# Patient Record
Sex: Male | Born: 1985 | Race: Black or African American | Hispanic: No | Marital: Married | Smoking: Current some day smoker
Health system: Southern US, Community
[De-identification: ages and names within clinical notes are randomized; demographics above are authoritative.]

---

## 2009-09-25 ENCOUNTER — Emergency Department (HOSPITAL_COMMUNITY): Admission: EM | Admit: 2009-09-25 | Discharge: 2009-09-25 | Payer: Self-pay | Admitting: Emergency Medicine

## 2013-02-13 ENCOUNTER — Encounter (HOSPITAL_COMMUNITY): Payer: Self-pay | Admitting: Emergency Medicine

## 2013-02-13 ENCOUNTER — Emergency Department (HOSPITAL_COMMUNITY)
Admission: EM | Admit: 2013-02-13 | Discharge: 2013-02-13 | Disposition: A | Discharge status: Home / Self Care | Payer: BC Managed Care – PPO | Attending: Emergency Medicine | Admitting: Emergency Medicine

## 2013-02-13 DIAGNOSIS — Y93H9 Activity, other involving exterior property and land maintenance, building and construction: Secondary | ICD-10-CM | POA: Insufficient documentation

## 2013-02-13 DIAGNOSIS — IMO0002 Reserved for concepts with insufficient information to code with codable children: Secondary | ICD-10-CM | POA: Insufficient documentation

## 2013-02-13 DIAGNOSIS — M549 Dorsalgia, unspecified: Secondary | ICD-10-CM

## 2013-02-13 DIAGNOSIS — T148XXA Other injury of unspecified body region, initial encounter: Secondary | ICD-10-CM

## 2013-02-13 DIAGNOSIS — Y929 Unspecified place or not applicable: Secondary | ICD-10-CM | POA: Insufficient documentation

## 2013-02-13 DIAGNOSIS — R209 Unspecified disturbances of skin sensation: Secondary | ICD-10-CM | POA: Insufficient documentation

## 2013-02-13 DIAGNOSIS — F17200 Nicotine dependence, unspecified, uncomplicated: Secondary | ICD-10-CM | POA: Insufficient documentation

## 2013-02-13 MED ORDER — CYCLOBENZAPRINE HCL 10 MG PO TABS: 10.0000 mg | ORAL_TABLET | Freq: Three times a day (TID) | ORAL | Status: DC | PRN

## 2013-02-13 MED ORDER — NAPROXEN 500 MG PO TABS: 500.0000 mg | ORAL_TABLET | Freq: Two times a day (BID) | ORAL | Status: DC

## 2013-02-13 NOTE — ED Provider Notes (Signed)
CSN: [id number]     Arrival date & time 02/13/13  2203 History   First MD Initiated Contact with Patient 02/13/13 2317     This chart was scribed for non-physician practitioner, Ivonne Andrew PA-C,   working with Hanley Seamen, MD by Arlan Organ, ED Scribe. This patient was seen in room WTR6/WTR6 and the patient's care was started at 11:36 PM.   Chief Complaint  Patient presents with  . Back Pain   The history is provided by the patient. No language interpreter was used.   HPI Comments: Anthony Baldwin is a 27 y.o. male who presents to the Emergency Department complaining of back pain that started earlier this morning. Pt also reports numbness in his left hand. Pt states he woke up with numbness in his left hand this morning, and after taking a nap, he states he experienced excruciating pain in his back. He says he took OTC medication with no relief.  He describes the pain as sharp, and states movement worsens the pain. Pain is also worsened with coughing and some deep breathing. 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 He denies any specific injury or trauma. He states that he generally has a sore back every day but never with the similar sharp pains. Pt denies fever, chills, diaphoresis, swelling, or nausea. No dysuria, hematuria or urinary frequency. He denies using IV drugs.  Pt denies any recent travel. Pt states he is otherwise healthy.    History reviewed. No pertinent past medical history. History reviewed. No pertinent past surgical history. History reviewed. No pertinent family history. History  Substance Use Topics  . Smoking status: Current Some Day Smoker  . Smokeless tobacco: Not on file  . Alcohol Use: Yes    Review of Systems  Constitutional: Negative for fever, chills and diaphoresis.  Respiratory: Negative for cough and shortness of breath.   Musculoskeletal: Positive for back pain.  Neurological: Negative for weakness.  All other systems  reviewed and are negative.    Allergies  Review of patient's allergies indicates no known allergies.  Home Medications   Current Outpatient Rx  Name  Route  Sig  Dispense  Refill  . ibuprofen (ADVIL,MOTRIN) 200 MG tablet   Oral   Take 600 mg by mouth every 8 (eight) hours as needed for pain.          BP 145/83  Pulse 98  Temp(Src) 99.5 F (37.5 C) (Oral)  Resp 18  Ht 6\' 1"  (1.854 m)  Wt 225 lb (102.059 kg)  BMI 29.69 kg/m2  SpO2 99%  Physical Exam  Nursing note and vitals reviewed. Constitutional: He is oriented to person, place, and time. He appears well-developed and well-nourished.  HENT:  Head: Normocephalic and atraumatic.  Eyes: EOM are normal.  Neck: Normal range of motion.  Cardiovascular: Normal rate.   Pulmonary/Chest: Effort normal.  Musculoskeletal: Normal range of motion.  Neurological: He is alert and oriented to person, place, and time.  Skin: Skin is warm and dry.  Psychiatric: He has a normal mood and affect. His behavior is normal.    ED Course  Procedures   DIAGNOSTIC STUDIES: Oxygen Saturation is 99% on RA, Normal by my interpretation.    COORDINATION OF CARE: 11:37 PM- patient seen and evaluated. No history of significant injury or trauma. Symptoms are reproducible with palpation and with movements. He appears muscular in nature. Patient is PERC negative.  Will give muscle relaxer. Discussed treatment plan with  pt at bedside and pt agreed to plan.     MDM   1. Back pain   2. Muscle strain      I personally performed the services described in this documentation, which was scribed in my presence. The recorded information has been reviewed and is accurate.           Angus Seller, PA-C 02/14/13 [id number]

## 2013-02-13 NOTE — ED Notes (Signed)
Pt c/o pain in left rib/back region. Increase with deep breath and movement.

## 2013-02-14 NOTE — ED Provider Notes (Signed)
Medical screening examination/treatment/procedure(s) were performed by non-physician practitioner and as supervising physician I was immediately available for consultation/collaboration.  EKG Interpretation   None         Markevius Trombetta L Brannen Koppen, MD 02/14/13 0724 

## 2013-04-22 ENCOUNTER — Encounter (HOSPITAL_COMMUNITY): Payer: Self-pay | Admitting: Emergency Medicine

## 2013-04-22 ENCOUNTER — Emergency Department (INDEPENDENT_AMBULATORY_CARE_PROVIDER_SITE_OTHER)
Admission: EM | Admit: 2013-04-22 | Discharge: 2013-04-22 | Disposition: A | Discharge status: Home / Self Care | Payer: Self-pay | Source: Home / Self Care

## 2013-04-22 DIAGNOSIS — S76312A Strain of muscle, fascia and tendon of the posterior muscle group at thigh level, left thigh, initial encounter: Secondary | ICD-10-CM

## 2013-04-22 DIAGNOSIS — J069 Acute upper respiratory infection, unspecified: Secondary | ICD-10-CM

## 2013-04-22 DIAGNOSIS — IMO0002 Reserved for concepts with insufficient information to code with codable children: Secondary | ICD-10-CM

## 2013-04-22 NOTE — ED Provider Notes (Signed)
Medical screening examination/treatment/procedure(s) were performed by resident physician or non-physician practitioner and as supervising physician I was immediately available for consultation/collaboration.   KINDL,JAMES DOUGLAS MD.   James D Kindl, MD 04/22/13 1330 

## 2013-04-22 NOTE — ED Notes (Signed)
Multiple complaints: uri: onset this am and limited to nasal stuffiness. Left leg pain from mid-thigh to mid calf.  Pain continuous, but increases with weight bearing.  No known injury.  Patient does "flooring" for work.

## 2013-04-22 NOTE — ED Provider Notes (Signed)
CSN: [id number]     Arrival date & time 04/22/13  0841 History   First MD Initiated Contact with Patient 04/22/13 [phone]     Chief Complaint  Patient presents with  . Leg Pain  . URI   (Consider location/radiation/quality/duration/timing/severity/associated sxs/prior Treatment) HPI Comments: 28 year old male is complaining of pain in his proximal calf and distal hamstring. He awoke this morning with stiffness and tightness in that area. It hurts to walk. He denies trauma, fall or other known injury. 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 Denies other musculoskeletal pain.  Also complains of nasal stuffiness since he awoke this morning.     History reviewed. No pertinent past medical history. History reviewed. No pertinent past surgical history. No family history on file. History  Substance Use Topics  . Smoking status: Former Games developer  . Smokeless tobacco: Not on file  . Alcohol Use: Yes    Review of Systems  Constitutional: Negative.   HENT: Positive for congestion and rhinorrhea.   Respiratory: Negative.   Cardiovascular: Negative.   Gastrointestinal: Negative.   Genitourinary: Negative.   Musculoskeletal: Positive for myalgias.       As per history of present illness  Neurological: Negative.     Allergies  Review of patient's allergies indicates no known allergies.  Home Medications   Current Outpatient Rx  Name  Route  Sig  Dispense  Refill  . cyclobenzaprine (FLEXERIL) 10 MG tablet   Oral   Take 1 tablet (10 mg total) by mouth 3 (three) times daily as needed for muscle spasms.   30 tablet   0   . ibuprofen (ADVIL,MOTRIN) 200 MG tablet   Oral   Take 600 mg by mouth every 8 (eight) hours as needed for pain.         . naproxen (NAPROSYN) 500 MG tablet   Oral   Take 1 tablet (500 mg total) by mouth 2 (two) times daily.   30 tablet   0    BP 147/66   Pulse 90  Temp(Src) 99 F (37.2 C) (Oral)  Resp 14  SpO2 98% Physical Exam  Nursing note and vitals reviewed. Constitutional: He is oriented to person, place, and time. He appears well-developed and well-nourished.  HENT:  Head: Normocephalic and atraumatic.  Eyes: EOM are normal. Left eye exhibits no discharge.  Neck: Normal range of motion. Neck supple.  Pulmonary/Chest: Effort normal. No respiratory distress.  Musculoskeletal: Normal range of motion. He exhibits tenderness. He exhibits no edema.  Tenderness is located along the hamstring tendons adjacent to the popliteal fossa. There is no tenderness to the calf muscle or hamstring muscle. Stretching of the calf muscle with Homans sign produces pain behind the knee. No tenderness to the quadriceps or anterior lower leg. Distal neurovascular motor sensory is intact. Normal color and warmth.  Neurological: He is alert and oriented to person, place, and time. No cranial nerve deficit.  Skin: Skin is warm and dry.  Psychiatric: He has a normal mood and affect.    ED Course  Procedures (including critical care time) Labs Review Labs Reviewed - No data to display Imaging Review No results found.    MDM   1. Hamstring strain, left, initial encounter   2. URI (upper respiratory infection)       Stretch the hamstring muscle as demonstrated. Apply ice for the first day or 2  and then apply heat. Minimize the squatting and other maneuvers that exacerbates pain. May take Alka-Seltzer cold nighttime remedy for URI symptoms. Followup with your PCP as needed.   Hayden Rasmussenavid Janathan Bribiesca, NP 04/22/13 1017

## 2013-04-22 NOTE — Discharge Instructions (Signed)
Hamstring Strain  Hamstrings are the large muscles in the back of the thighs. A strain or tear injury happens when there is a sudden stretch or pull on these muscles and tendons. Tendons are cord like structures that attach muscle to bone. These injuries are commonly seen in activities such as sprinting due to sudden acceleration.  DIAGNOSIS  Often the diagnosis can be made by examination. HOME CARE INSTRUCTIONS   Apply ice to the sore area for 15-48minutes, 03-04 times per day. Do this while awake for the first 2 days. Put the ice in a plastic bag, and place a towel between the bag of ice and your skin.  Keep your knee flexed when possible. This means your foot is held off the ground slightly if you are on crutches. When lying down, a pillow under the knee will take strain off the muscles and provide some relief.  If a compression bandage such as an ace wrap was applied, use it until you are seen again. You may remove it for sleeping, showers and baths. If the wrap seems to be too tight and is uncomfortable, wrap it more loosely. If your toes or foot are getting cold or blue, it is too tight.  Walk or move around as the pain allows, or as instructed. Resume full activities as suggested by your caregiver. This is often safest when the strength of the injured leg has nearly returned to normal.  Only take over-the-counter or prescription medicines for pain, discomfort, or fever as directed by your caregiver. SEEK MEDICAL CARE IF:   You have an increase in bruising, swelling or pain.  You notice coldness or blueness of your toes or foot.  Pain relief is not obtained with medications.  You have increasing pain in the area and seem to be getting worse rather than better.  You notice your thigh getting larger in size (this could indicate bleeding into the muscle). Document Released: 12/31/2000 Document Revised: 06/30/2011 Document Reviewed: 04/09/2008 [facility] Patient Information 2014  [place], [place].   Ligament Sprain A ligament sprain is when the bands of tissue that hold bones together (ligament) are stretched. HOME CARE   Rest the injured area.  Start using the joint when told to by your doctor.  Keep the injured area raised (elevated) above the level of the heart. This may lessen puffiness (swelling).  Put ice on the injured area.  Put ice in a plastic bag.  Place a towel between your skin and the bag.  Leave the ice on for 15-20 minutes, 03-04 times a day.  Wear a splint, cast, or an elastic bandage as told by your doctor.  Only take medicine as told by your doctor.  Use crutches as told by your doctor. Do not put weight on the injured joint until told to by your doctor. GET HELP RIGHT AWAY IF:   You have more bruising, puffiness, or pain.  The leg was injured and the toes are cold, tingling, numb, or blue.  The arm was injured and the fingers are cold, tingling, numb, or blue.  The pain is not helped with medicine.  The pain gets worse. MAKE SURE YOU:   Understand these instructions.  Will watch this condition.  Will get help right away if you are not doing well or get worse. Document Released: 09/24/2007 Document Revised: 06/30/2011 Document Reviewed: 09/24/2007 [facility] Patient Information 2014 [place], [place].  Upper Respiratory Infection, Adult An upper respiratory infection (URI) is also sometimes known as the common cold.  The upper respiratory tract includes the nose, sinuses, throat, trachea, and bronchi. Bronchi are the airways leading to the lungs. Most people improve within 1 week, but symptoms can last up to 2 weeks. A residual cough may last even longer.  CAUSES Many different viruses can infect the tissues lining the upper respiratory tract. The tissues become irritated and inflamed and often become very moist. Mucus production is also common. A cold is contagious. You can easily spread the virus to others by oral contact. This  includes kissing, sharing a glass, coughing, or sneezing. Touching your mouth or nose and then touching a surface, which is then touched by another person, can also spread the virus. SYMPTOMS  Symptoms typically develop 1 to 3 days after you come in contact with a cold virus. Symptoms vary from person to person. They may include:  Runny nose.  Sneezing.  Nasal congestion.  Sinus irritation.  Sore throat.  Loss of voice (laryngitis).  Cough.  Fatigue.  Muscle aches.  Loss of appetite.  Headache.  Low-grade fever. DIAGNOSIS  You might diagnose your own cold based on familiar symptoms, since most people get a cold 2 to 3 times a year. Your caregiver can confirm this based on your exam. Most importantly, your caregiver can check that your symptoms are not due to another disease such as strep throat, sinusitis, pneumonia, asthma, or epiglottitis. Blood tests, throat tests, and X-rays are not necessary to diagnose a common cold, but they may sometimes be helpful in excluding other more serious diseases. Your caregiver will decide if any further tests are required. RISKS AND COMPLICATIONS  You may be at risk for a more severe case of the common cold if you smoke cigarettes, have chronic heart disease (such as heart failure) or lung disease (such as asthma), or if you have a weakened immune system. The very young and very old are also at risk for more serious infections. Bacterial sinusitis, middle ear infections, and bacterial pneumonia can complicate the common cold. The common cold can worsen asthma and chronic obstructive pulmonary disease (COPD). Sometimes, these complications can require emergency medical care and may be life-threatening. PREVENTION  The best way to protect against getting a cold is to practice good hygiene. Avoid oral or hand contact with people with cold symptoms. Wash your hands often if contact occurs. There is no clear evidence that vitamin C, vitamin E, echinacea,  or exercise reduces the chance of developing a cold. However, it is always recommended to get plenty of rest and practice good nutrition. TREATMENT  Treatment is directed at relieving symptoms. There is no cure. Antibiotics are not effective, because the infection is caused by a virus, not by bacteria. Treatment may include:  Increased fluid intake. Sports drinks offer valuable electrolytes, sugars, and fluids.  Breathing heated mist or steam (vaporizer or shower).  Eating chicken soup or other clear broths, and maintaining good nutrition.  Getting plenty of rest.  Using gargles or lozenges for comfort.  Controlling fevers with ibuprofen or acetaminophen as directed by your caregiver.  Increasing usage of your inhaler if you have asthma. Zinc gel and zinc lozenges, taken in the first 24 hours of the common cold, can shorten the duration and lessen the severity of symptoms. Pain medicines may help with fever, muscle aches, and throat pain. A variety of non-prescription medicines are available to treat congestion and runny nose. Your caregiver can make recommendations and may suggest nasal or lung inhalers for other symptoms.  HOME CARE  INSTRUCTIONS   Only take over-the-counter or prescription medicines for pain, discomfort, or fever as directed by your caregiver.  Use a warm mist humidifier or inhale steam from a shower to increase air moisture. This may keep secretions moist and make it easier to breathe.  Drink enough water and fluids to keep your urine clear or pale yellow.  Rest as needed.  Return to work when your temperature has returned to normal or as your caregiver advises. You may need to stay home longer to avoid infecting others. You can also use a face mask and careful hand washing to prevent spread of the virus. SEEK MEDICAL CARE IF:   After the first few days, you feel you are getting worse rather than better.  You need your caregiver's advice about medicines to control  symptoms.  You develop chills, worsening shortness of breath, or brown or red sputum. These may be signs of pneumonia.  You develop yellow or brown nasal discharge or pain in the face, especially when you bend forward. These may be signs of sinusitis.  You develop a fever, swollen neck glands, pain with swallowing, or white areas in the back of your throat. These may be signs of strep throat. SEEK IMMEDIATE MEDICAL CARE IF:   You have a fever.  You develop severe or persistent headache, ear pain, sinus pain, or chest pain.  You develop wheezing, a prolonged cough, cough up blood, or have a change in your usual mucus (if you have chronic lung disease).  You develop sore muscles or a stiff neck. Document Released: 10/01/2000 Document Revised: 06/30/2011 Document Reviewed: 08/09/2010 [facility] Patient Information 2014 [place]

## 2013-12-04 ENCOUNTER — Emergency Department (HOSPITAL_COMMUNITY)
Admission: EM | Admit: 2013-12-04 | Discharge: 2013-12-05 | Disposition: A | Discharge status: Home / Self Care | Payer: No Typology Code available for payment source | Attending: Emergency Medicine | Admitting: Emergency Medicine

## 2013-12-04 ENCOUNTER — Encounter (HOSPITAL_COMMUNITY): Payer: Self-pay | Admitting: Emergency Medicine

## 2013-12-04 DIAGNOSIS — Z79899 Other long term (current) drug therapy: Secondary | ICD-10-CM | POA: Diagnosis not present

## 2013-12-04 DIAGNOSIS — Z87891 Personal history of nicotine dependence: Secondary | ICD-10-CM | POA: Diagnosis not present

## 2013-12-04 DIAGNOSIS — IMO0002 Reserved for concepts with insufficient information to code with codable children: Secondary | ICD-10-CM | POA: Diagnosis present

## 2013-12-04 DIAGNOSIS — Y9389 Activity, other specified: Secondary | ICD-10-CM | POA: Insufficient documentation

## 2013-12-04 DIAGNOSIS — Y92410 Unspecified street and highway as the place of occurrence of the external cause: Secondary | ICD-10-CM | POA: Diagnosis not present

## 2013-12-04 MED ORDER — IBUPROFEN 600 MG PO TABS: 600.0000 mg | ORAL_TABLET | Freq: Four times a day (QID) | ORAL | Status: DC | PRN

## 2013-12-04 MED ORDER — CYCLOBENZAPRINE HCL 5 MG PO TABS: 5.0000 mg | ORAL_TABLET | Freq: Three times a day (TID) | ORAL | Status: DC | PRN

## 2013-12-04 MED ORDER — IBUPROFEN 200 MG PO TABS
600.0000 mg | ORAL_TABLET | Freq: Once | ORAL | Status: AC
  Administered 2013-12-05: 600 mg via ORAL
  Filled 2013-12-04: qty 3
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
## 2013-12-04 NOTE — Discharge Instructions (Signed)

## 2013-12-05 NOTE — ED Provider Notes (Signed)
Medical screening examination/treatment/procedure(s) were performed by non-physician practitioner and as supervising physician I was immediately available for consultation/collaboration.   EKG Interpretation None       Shakala Marlatt M Arsema Tusing, MD 12/05/13 0423 

## 2013-12-06 ENCOUNTER — Emergency Department (INDEPENDENT_AMBULATORY_CARE_PROVIDER_SITE_OTHER)
Admission: EM | Admit: 2013-12-06 | Discharge: 2013-12-06 | Disposition: A | Discharge status: Home / Self Care | Payer: Self-pay | Source: Home / Self Care | Attending: Family Medicine | Admitting: Family Medicine

## 2013-12-06 ENCOUNTER — Emergency Department (INDEPENDENT_AMBULATORY_CARE_PROVIDER_SITE_OTHER): Payer: Self-pay

## 2013-12-06 ENCOUNTER — Emergency Department (HOSPITAL_COMMUNITY): Payer: Self-pay

## 2013-12-06 ENCOUNTER — Encounter (HOSPITAL_COMMUNITY): Payer: Self-pay | Admitting: Emergency Medicine

## 2013-12-06 DIAGNOSIS — IMO0001 Reserved for inherently not codable concepts without codable children: Secondary | ICD-10-CM

## 2013-12-06 DIAGNOSIS — M7918 Myalgia, other site: Secondary | ICD-10-CM

## 2013-12-06 MED ORDER — PREDNISONE 50 MG PO TABS: ORAL_TABLET | ORAL | Status: DC
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
## 2013-12-06 NOTE — Discharge Instructions (Signed)
Your pain is all from muscle strain and spasms.  There is no evidence of fracture or other major injury Please start the prednisone Restart motrin 600 after finishing the prednisone. Take this every 6 hours for several days if needed Remember to ice you chest tonight and start heat tomorrow.  Please stay active and stretch and massage the area regularly Please come back in 4 weeks if you are not better

## 2014-07-03 ENCOUNTER — Encounter (HOSPITAL_COMMUNITY): Payer: Self-pay | Admitting: Emergency Medicine

## 2014-07-03 ENCOUNTER — Emergency Department (HOSPITAL_COMMUNITY)
Admission: EM | Admit: 2014-07-03 | Discharge: 2014-07-03 | Disposition: A | Discharge status: Home / Self Care | Payer: No Typology Code available for payment source | Attending: Emergency Medicine | Admitting: Emergency Medicine

## 2014-07-03 DIAGNOSIS — Y998 Other external cause status: Secondary | ICD-10-CM | POA: Insufficient documentation

## 2014-07-03 DIAGNOSIS — Y9389 Activity, other specified: Secondary | ICD-10-CM | POA: Diagnosis not present

## 2014-07-03 DIAGNOSIS — Z87891 Personal history of nicotine dependence: Secondary | ICD-10-CM | POA: Insufficient documentation

## 2014-07-03 DIAGNOSIS — T148 Other injury of unspecified body region: Secondary | ICD-10-CM | POA: Diagnosis not present

## 2014-07-03 DIAGNOSIS — Z7952 Long term (current) use of systemic steroids: Secondary | ICD-10-CM | POA: Diagnosis not present

## 2014-07-03 DIAGNOSIS — Y92410 Unspecified street and highway as the place of occurrence of the external cause: Secondary | ICD-10-CM | POA: Diagnosis not present

## 2014-07-03 DIAGNOSIS — Z041 Encounter for examination and observation following transport accident: Secondary | ICD-10-CM | POA: Diagnosis present

## 2014-07-03 DIAGNOSIS — M791 Myalgia, unspecified site: Secondary | ICD-10-CM

## 2014-07-03 MED ORDER — IBUPROFEN 200 MG PO TABS
600.0000 mg | ORAL_TABLET | Freq: Once | ORAL | Status: AC
  Administered 2014-07-03: 600 mg via ORAL
  Filled 2014-07-03: qty 3

## 2014-07-03 MED ORDER — IBUPROFEN 600 MG PO TABS: 600.0000 mg | ORAL_TABLET | Freq: Three times a day (TID) | ORAL | Status: DC
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
## 2014-08-04 ENCOUNTER — Ambulatory Visit
Admission: RE | Admit: 2014-08-04 | Discharge: 2014-08-04 | Disposition: A | Discharge status: Home / Self Care | Payer: No Typology Code available for payment source | Source: Ambulatory Visit | Attending: Internal Medicine | Admitting: Internal Medicine

## 2014-08-04 ENCOUNTER — Other Ambulatory Visit: Payer: Self-pay | Admitting: Internal Medicine

## 2014-08-04 DIAGNOSIS — R52 Pain, unspecified: Secondary | ICD-10-CM

## 2015-07-25 ENCOUNTER — Emergency Department (HOSPITAL_COMMUNITY): Payer: Self-pay

## 2015-07-25 ENCOUNTER — Emergency Department (HOSPITAL_COMMUNITY)
Admission: EM | Admit: 2015-07-25 | Discharge: 2015-07-25 | Disposition: A | Discharge status: Home / Self Care | Payer: Self-pay | Attending: Emergency Medicine | Admitting: Emergency Medicine

## 2015-07-25 ENCOUNTER — Encounter (HOSPITAL_COMMUNITY): Payer: Self-pay | Admitting: Emergency Medicine

## 2015-07-25 DIAGNOSIS — Y9289 Other specified places as the place of occurrence of the external cause: Secondary | ICD-10-CM | POA: Insufficient documentation

## 2015-07-25 DIAGNOSIS — W010XXA Fall on same level from slipping, tripping and stumbling without subsequent striking against object, initial encounter: Secondary | ICD-10-CM | POA: Insufficient documentation

## 2015-07-25 DIAGNOSIS — Z87891 Personal history of nicotine dependence: Secondary | ICD-10-CM | POA: Insufficient documentation

## 2015-07-25 DIAGNOSIS — Y998 Other external cause status: Secondary | ICD-10-CM | POA: Insufficient documentation

## 2015-07-25 DIAGNOSIS — S6991XA Unspecified injury of right wrist, hand and finger(s), initial encounter: Secondary | ICD-10-CM | POA: Insufficient documentation

## 2015-07-25 DIAGNOSIS — Z791 Long term (current) use of non-steroidal anti-inflammatories (NSAID): Secondary | ICD-10-CM | POA: Insufficient documentation

## 2015-07-25 DIAGNOSIS — Y9389 Activity, other specified: Secondary | ICD-10-CM | POA: Insufficient documentation

## 2015-07-25 MED ORDER — IBUPROFEN 600 MG PO TABS: 600.0000 mg | ORAL_TABLET | Freq: Three times a day (TID) | ORAL | Status: DC | PRN

## 2015-07-25 NOTE — Discharge Instructions (Signed)
Please follow up with hand specialist in a week if you are unable to fully extend your fingers.  Take ibuprofen as needed for pain.  Wear ace wrap for support.    Intermetacarpal Sprain The intermetacarpal ligaments run between the knuckles at the base of the fingers. These ligaments are vulnerable to sprain and injury in which the ligament becomes overstretched or torn. Intermetacarpal sprains are classified into 3 categories. Grade 1 sprains cause pain, but the tendon is not lengthened. Grade 2 sprains include a lengthened ligament, due to the ligament being stretched or partially ruptured. With grade 2 sprains there is still function, although function may be decreased. Grade 3 sprains include a complete tear of the ligament, and the joint usually displays a loss of function.  SYMPTOMS   Severe pain at the time of injury.  Often, a feeling of popping or tearing inside the hand.  Tenderness and inflammation at the knuckles.  Bruising within a couple days of injury.  Impaired ability to use the hand. CAUSES  This condition occurs when the intermetacarpal ligaments are subjected to a greater stress than they can handle. This causes the ligaments to become stretched or torn. RISK INCREASES WITH:  Previous hand injury.  Fighting sports (boxing, wrestling, martial arts).  Sports in which you could fall on an outstretched hand (soccer, basketball, volleyball).  Other sports with repeated hand trauma (water polo, gymnastics).  Poor hand strength and flexibility.  Inadequate or poorly fitted protective equipment. PREVENTION   Warm up and stretch properly before activity.  Maintain appropriate conditioning:  Hand flexibility.  Muscle strength and endurance.  Applying tape, protective strapping, or a brace may help prevent injury.  Provide the hand with support during sports and practice activities for 6 to 12 months following injury. PROGNOSIS  With proper treatment, healing  should occur without impairment. The length of healing varies from 2 to 12 weeks, depending on the severity of injury. RELATED COMPLICATIONS   Longer healing time, if activities are resumed too soon.  Recurring symptoms or repeated injury, resulting in a chronic problem.  Injury to other nearby structures (bone, cartilage, tendon).  Arthritis of the knuckle (intermetacarpal) joint, with repeated sprains.  Prolonged disability (sometimes).  Hand and finger stiffness or weakness. TREATMENT Treatment first involves ice and medicine to reduce pain and inflammation. An elastic compression bandage may be worn to reduce discomfort and to protect the area. Depending on the severity of injury, you may be required to restrain the area with a cast, splint, or brace. After the ligament has been allowed to heal, strengthening and stretching exercises may be needed to regain strength and a full range of motion. Exercises may be completed at home or with a therapist. Surgery is rarely needed. MEDICATION   If pain medicine is needed, nonsteroidal anti-inflammatory medicines (aspirin and ibuprofen), or other minor pain relievers (acetaminophen), are often advised.  Do not take pain medicine for 7 days before surgery.  Stronger pain relievers may be prescribed if your caregiver thinks they are needed. Use only as directed and only as much as you need. HEAT AND COLD  Cold treatment (icing) should be applied for 10 to 15 minutes every 2 to 3 hours for inflammation and pain, and immediately after activity that aggravates your symptoms. Use ice packs or an ice massage.  Heat treatment may be used before performing stretching and strengthening activities prescribed by your caregiver, physical therapist, or athletic trainer. Use a heat pack or a warm water soak.  SEEK MEDICAL CARE IF:   Symptoms remain or get worse, despite treatment for longer than 2 to 4 weeks.  You experience pain, numbness, discoloration,  or coldness in the hand or fingers.  You develop blue, gray, or dark fingernails.  Any of the following occur after surgery: increased pain, swelling, redness, drainage of fluids, bleeding in the affected area, or signs of infection, including fever.  New, unexplained symptoms develop. (Drugs used in treatment may produce side effects.)   This information is not intended to replace advice given to you by your health care provider. Make sure you discuss any questions you have with your health care provider.   Document Released: 04/07/2005 Document Revised: 04/28/2014 Document Reviewed: 09/25/2014 Elsevier Interactive Patient Education [employer]

## 2015-07-25 NOTE — ED Provider Notes (Signed)
CSN: [id number]     Arrival date & time 07/25/15  1014 History  By signing my name below, I, Anthony Baldwin, attest that this documentation has been prepared under the direction and in the presence of non-physician practitioner, Anthony Helper, PA-C. Electronically Signed: Freida Baldwin, Scribe. 07/25/2015. 10:40 .    Chief Complaint  Patient presents with  . Hand Injury    The history is provided by the patient. No language interpreter was used.    HPI Comments:  Anthony Baldwin is a 30 y.o. male who presents to the Emergency Department complaining of sharp pain to his right pinky and ring fingers s/p fall last night. Pt states he tripped and extended his hand to try and catch himself but landed on his knuckles. No LOC or head injury. He reports associated swelling to the fingers, that has improved since applying ice. He denies numbness, and right elbow/wrist pain. Pt is ambidextrous. Pt mentioned he's unable to fully extended his ring and pinky fingers.  Denies prior injury to the same hand.  Denies numbness.     No past medical history on file. No past surgical history on file. No family history on file. Social History  Substance Use Topics  . Smoking status: Former Games developer  . Smokeless tobacco: Not on file  . Alcohol Use: Yes     Comment: occasionally     Review of Systems  Musculoskeletal: Positive for myalgias and arthralgias.  Neurological: Negative for syncope, weakness, numbness and headaches.    Allergies  Review of patient's allergies indicates no known allergies.  Home Medications   Prior to Admission medications   Medication Sig Start Date End Date Taking? Authorizing Provider  cyclobenzaprine (FLEXERIL) 5 MG tablet Take 1 tablet (5 mg total) by mouth 3 (three) times daily as needed for muscle spasms. Patient not taking: Reported on 07/03/2014 12/04/13   Earley Favor, NP  ibuprofen (ADVIL,MOTRIN) 600 MG tablet Take 1 tablet (600 mg total) by mouth 3 (three) times daily. 07/03/14    Earley Favor, NP  predniSONE (DELTASONE) 50 MG tablet Take daily with breakfast Patient not taking: Reported on 07/03/2014 12/06/13   Ozella Rocks, MD   BP 142/71 mmHg  Pulse 73  Temp(Src) 98.1 F (36.7 C) (Oral)  Resp 17  SpO2 100% Physical Exam  Constitutional: He is oriented to person, place, and time. He appears well-developed and well-nourished. No distress.  HENT:  Head: Normocephalic and atraumatic.  Eyes: Conjunctivae are normal.  Cardiovascular: Normal rate.   Pulmonary/Chest: Effort normal.  Abdominal: He exhibits no distension.  Musculoskeletal:  Right hand: Point tenderness to 4th and 5th MCP with palpation; edema noted Unable to fully extend 4th and 5th fingers with active ROM but nml passive No bruising Brisk cap refill to all fingers sensation intact  R wrist nontender with FROM.  Radial pulse 2+  Neurological: He is alert and oriented to person, place, and time.  Skin: Skin is warm and dry.  Psychiatric: He has a normal mood and affect.  Nursing note and vitals reviewed.   ED Course  Procedures   DIAGNOSTIC STUDIES:  Oxygen Saturation is 100% on RA, normal by my interpretation.    COORDINATION OF CARE:  10:36 AM Discussed treatment plan with pt at bedside and pt agreed to plan.   Imaging Review Dg Hand Complete Right  07/25/2015  CLINICAL DATA:  Pt fell last night landing on right hand - injured 4th and 5th digits in some way, can only straighten out  those 2 fingers if he presses down or uses other hand - pain around 4th and 5th MTP joint area EXAM: RIGHT HAND - COMPLETE 3+ VIEW COMPARISON:  None. FINDINGS: There is no evidence of fracture or dislocation. There is no evidence of arthropathy or other focal bone abnormality. Soft tissues are unremarkable. IMPRESSION: Negative. Electronically Signed   By: Corlis Leak  Hassell M.D.   On: 07/25/2015 11:16   I have personally reviewed and evaluated these images and lab results as part of my medical  decision-making.   MDM   Final diagnoses:  Hand injury, right, initial encounter    BP 142/71 mmHg  Pulse 73  Temp(Src) 98.1 F (36.7 C) (Oral)  Resp 17  SpO2 100%  I personally performed the services described in this documentation, which was scribed in my presence. The recorded information has been reviewed and is accurate.     11:27 AM Pt fell and injured his R hand with decreased ROM to 4th-5th fingers.  He has swelling noted to 4th-5th MCP but hand xray negative.  Suspect sprain vs extensor tendon injury.  Finger splint provided, RICE therapy discussed and recommend to f/u with hand specialist for further care.     Anthony HelperBowie Aden Youngman, PA-C 07/25/15 1134  Anthony Grizzleanielle Ray, MD 07/25/15 [phone]

## 2015-07-25 NOTE — ED Notes (Signed)
Patient states tripped yesterday and landed on R hand.  Patient complaining of limited movement of 4th and 5th digits.  Patient states pain to top of hand knuckles.

## 2015-07-25 NOTE — ED Notes (Signed)
See PA additional assessment.

## 2016-08-13 ENCOUNTER — Ambulatory Visit: Payer: Self-pay | Admitting: Physician Assistant

## 2016-12-15 IMAGING — CR DG HAND COMPLETE 3+V*R*
3 series · 3 of 3 positions shown · non-contrast
Comparison: None.

CLINICAL DATA: Pt fell last night landing on right hand - injured
4th and 5th digits in some way, can only straighten [DATE]
fingers if he presses down or uses other hand - pain around 4th and
5th MTP joint area

EXAM:
RIGHT HAND - COMPLETE 3+ VIEW

[hand pa]
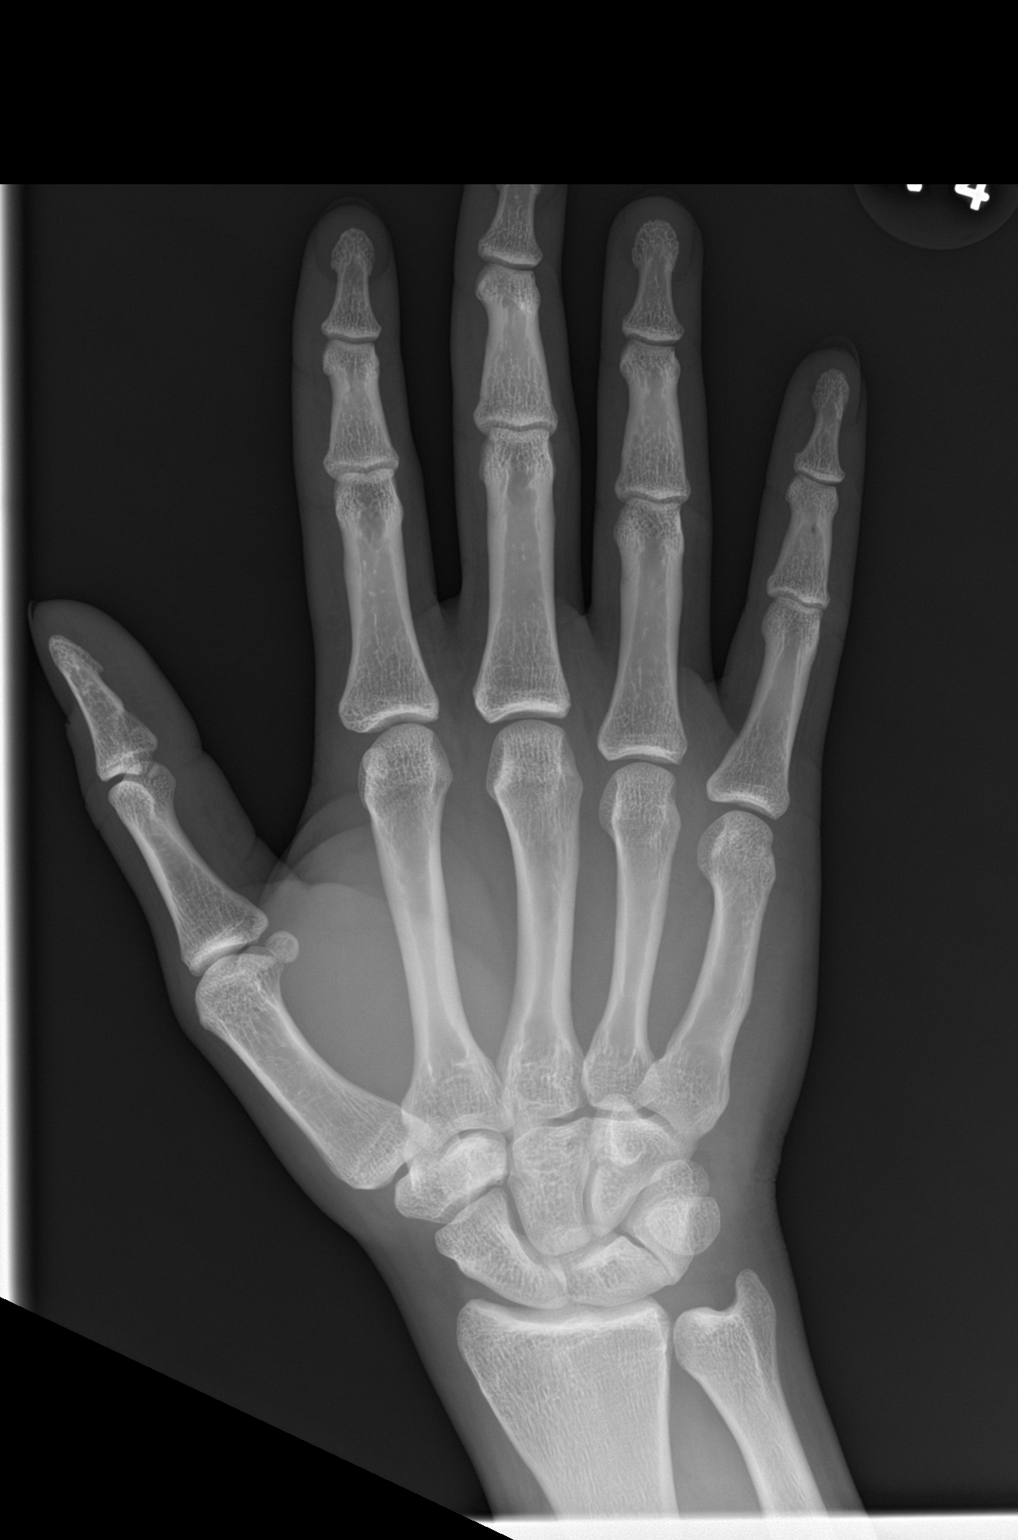

[hand obl]
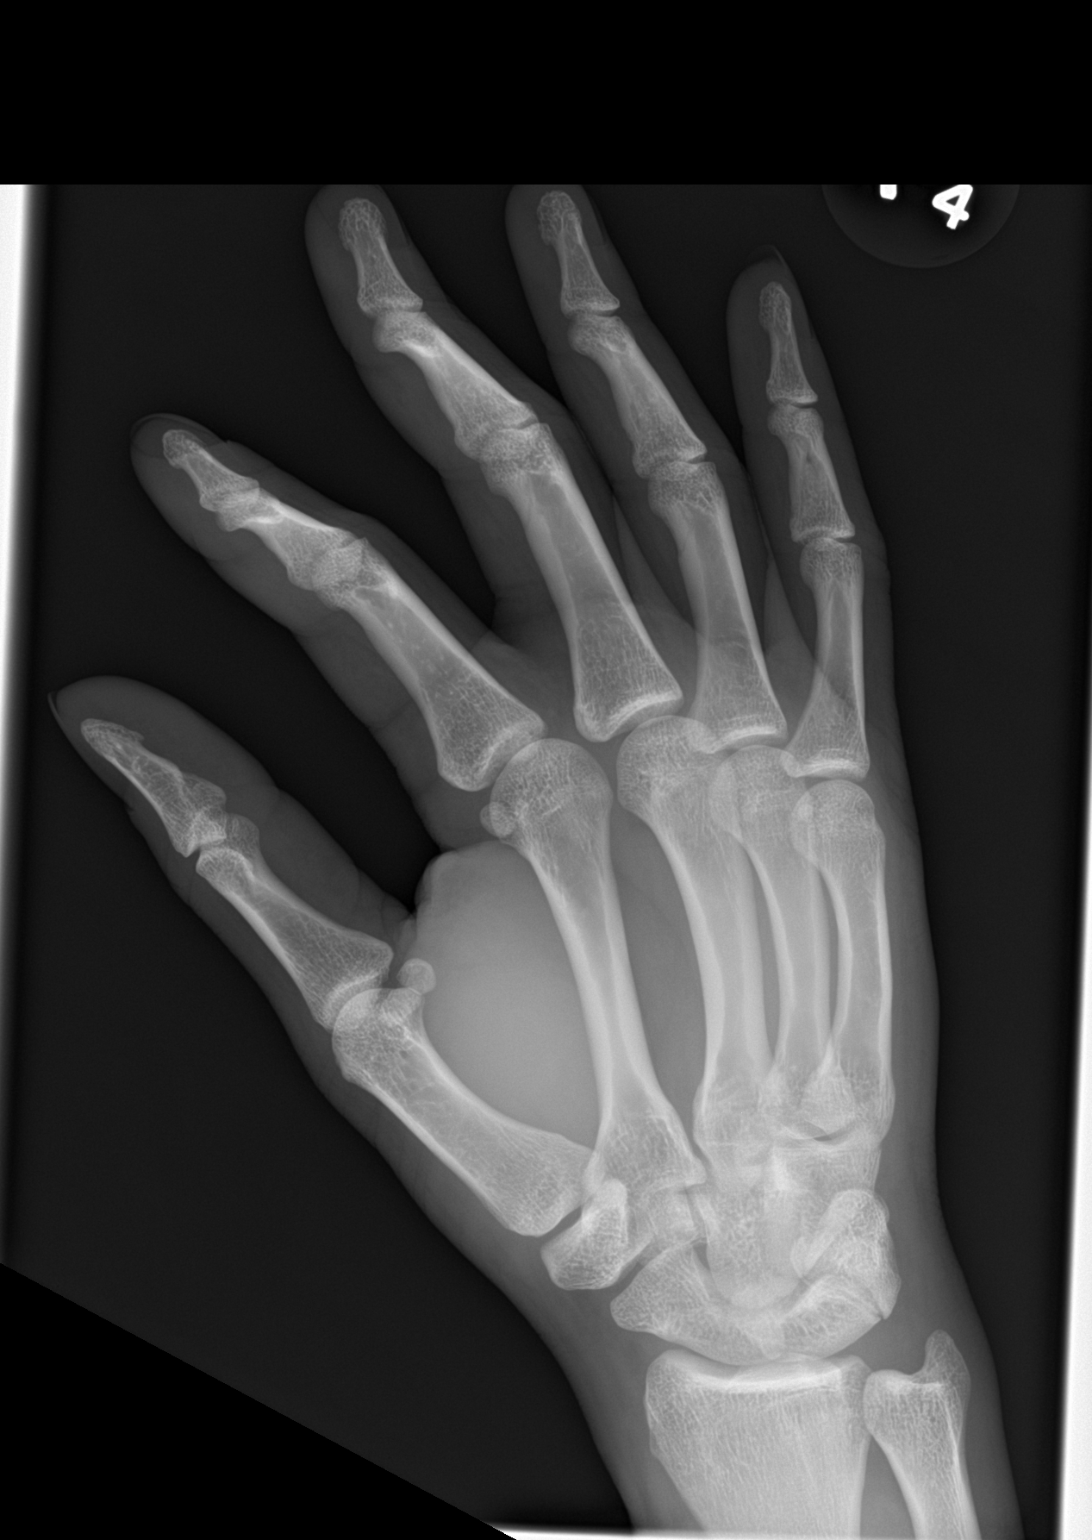

[hand lat]
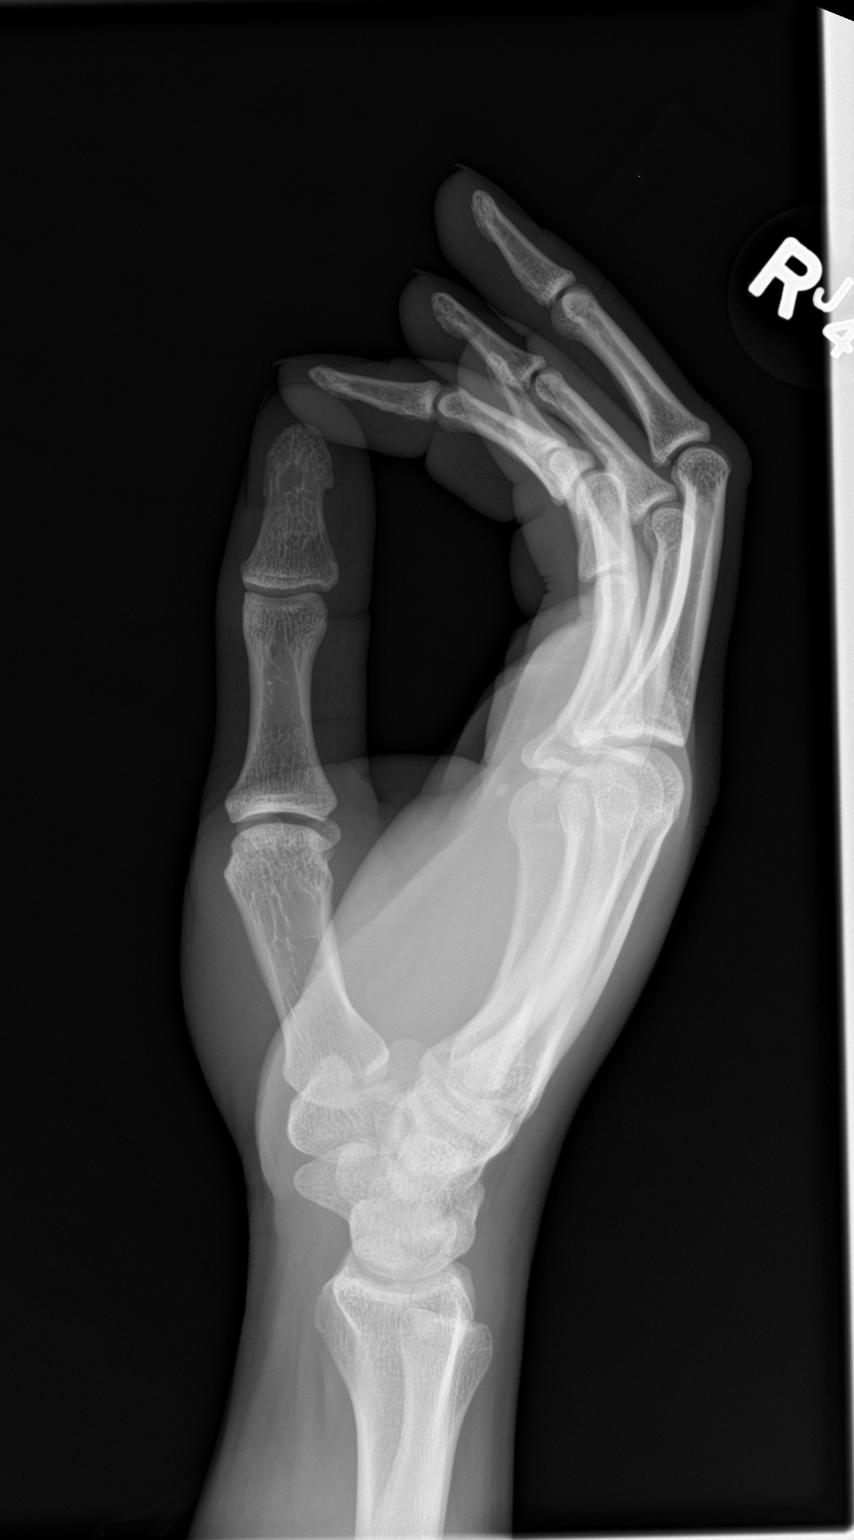

[3 of 3 positions shown; findings below may reference images not displayed]

FINDINGS: There is no evidence of fracture or dislocation. There is no
evidence of arthropathy or other focal bone abnormality. Soft
tissues are unremarkable.
IMPRESSION: Negative.

## 2017-01-12 ENCOUNTER — Ambulatory Visit: Payer: Self-pay | Admitting: Allergy and Immunology

## 2020-04-19 ENCOUNTER — Ambulatory Visit
Admission: EM | Admit: 2020-04-19 | Discharge: 2020-04-19 | Disposition: A | Discharge status: Home / Self Care | Payer: BLUE CROSS/BLUE SHIELD | Attending: Emergency Medicine | Admitting: Emergency Medicine

## 2020-04-19 ENCOUNTER — Other Ambulatory Visit: Payer: Self-pay

## 2020-04-19 DIAGNOSIS — J069 Acute upper respiratory infection, unspecified: Secondary | ICD-10-CM

## 2020-04-19 MED ORDER — IBUPROFEN 800 MG PO TABS: 800.0000 mg | ORAL_TABLET | Freq: Three times a day (TID) | ORAL | 0 refills | Status: AC

## 2020-04-19 MED ORDER — BENZONATATE 200 MG PO CAPS: 200.0000 mg | ORAL_CAPSULE | Freq: Three times a day (TID) | ORAL | 0 refills | Status: AC | PRN

## 2020-04-19 NOTE — ED Triage Notes (Signed)
Pt presents with complaints of sore throat, cold chills, cough and nasal congestion x 5 days. Reports pain with cough.

## 2020-04-19 NOTE — ED Provider Notes (Signed)
[facility]    CSN: [id number] Arrival date & time: 04/19/20  1033      History   Chief Complaint Chief Complaint  Patient presents with   Cough    HPI Rogers Ditter is a 34 y.o. male presenting today for evaluation of URI symptoms. Said had sore throat cough and congestion. Significant other here with similar symptoms.  Reports symptoms began approximately 5 days ago.  Did develop a discomfort in his left chest last night but this is since resolved.  Denies any further chest discomfort with cough or at rest.  Also reports a dry throat for  HPI  History reviewed. No pertinent past medical history.  There are no problems to display for this patient.   History reviewed. No pertinent surgical history.     Home Medications    Prior to Admission medications   Medication Sig Start Date End Date Taking? Authorizing Provider  benzonatate (TESSALON) 200 MG capsule Take 1 capsule (200 mg total) by mouth 3 (three) times daily as needed for up to 7 days for cough. 04/19/20 04/26/20 Yes Tyronne Blann C, PA-C  ibuprofen (ADVIL) 800 MG tablet Take 1 tablet (800 mg total) by mouth 3 (three) times daily. 04/19/20  Yes Almena Hokenson, Junius Creamer, PA-C    Family History History reviewed. No pertinent family history.  Social History Social History   Tobacco Use   Smoking status: Current Some Day Smoker    Types: Cigarettes  Substance Use Topics   Alcohol use: Yes    Comment: occasionally    Drug use: Yes    Types: Marijuana     Allergies   Patient has no known allergies.   Review of Systems Review of Systems  Constitutional: Negative for activity change, appetite change, chills, fatigue and fever.  HENT: Positive for congestion, rhinorrhea, sinus pressure and sore throat. Negative for ear pain and trouble swallowing.   Eyes: Negative for discharge and redness.  Respiratory: Positive for cough. Negative for chest tightness and shortness of breath.   Cardiovascular:  Negative for chest pain.  Gastrointestinal: Negative for abdominal pain, diarrhea, nausea and vomiting.  Musculoskeletal: Negative for myalgias.  Skin: Negative for rash.  Neurological: Negative for dizziness, light-headedness and headaches.     Physical Exam Triage Vital Signs ED Triage Vitals  Enc Vitals Group     BP      Pulse      Resp      Temp      Temp src      SpO2      Weight      Height      Head Circumference      Peak Flow      Pain Score      Pain Loc      Pain Edu?      Excl. in GC?    No data found.  Updated Vital Signs BP 116/82    Pulse 74    Temp 98 F (36.7 C)    Resp 19    SpO2 97%   Visual Acuity Right Eye Distance:   Left Eye Distance:   Bilateral Distance:    Right Eye Near:   Left Eye Near:    Bilateral Near:     Physical Exam Vitals and nursing note reviewed.  Constitutional:      Appearance: He is well-developed and well-nourished.     Comments: No acute distress  HENT:     Head: Normocephalic and atraumatic.  Ears:     Comments: Bilateral ears without tenderness to palpation of external auricle, tragus and mastoid, EAC's without erythema or swelling, TM's with good bony landmarks and cone of light. Non erythematous.     Nose: Nose normal.     Mouth/Throat:     Comments: Oral mucosa pink and moist, no tonsillar enlargement or exudate. Posterior pharynx patent and nonerythematous, no uvula deviation or swelling. Normal phonation. Eyes:     Conjunctiva/sclera: Conjunctivae normal.  Cardiovascular:     Rate and Rhythm: Normal rate.  Pulmonary:     Effort: Pulmonary effort is normal. No respiratory distress.     Comments: Breathing comfortably at rest, CTABL, no wheezing, rales or other adventitious sounds auscultated Abdominal:     General: There is no distension.  Musculoskeletal:        General: Normal range of motion.     Cervical back: Neck supple.  Skin:    General: Skin is warm and dry.  Neurological:     Mental  Status: He is alert and oriented to person, place, and time.  Psychiatric:        Mood and Affect: Mood and affect normal.      UC Treatments / Results  Labs (all labs ordered are listed, but only abnormal results are displayed) Labs Reviewed  NOVEL CORONAVIRUS, NAA    EKG   Radiology No results found.  Procedures Procedures (including critical care time)  Medications Ordered in UC Medications - No data to display  Initial Impression / Assessment and Plan / UC Course  I have reviewed the triage vital signs and the nursing notes.  Pertinent labs & imaging results that were available during my care of the patient were reviewed by me and considered in my medical decision making (see chart for details).     Viral URI with cough-Covid test pending, exam reassuring.  Recommend symptomatic and supportive care rest and fluids.  Discussed strict return precautions. Patient verbalized understanding and is agreeable with plan.  Final Clinical Impressions(s) / UC Diagnoses   Final diagnoses:  Viral URI with cough     Discharge Instructions     Covid test pending, monitor my chart for results Tylenol and ibuprofen for any body aches, headaches, fevers Tessalon for cough as needed Continue Claritin-D, may continue other over-the-counter medicines for cough and congestion Rest and fluids Follow-up if not improving or worsening    ED Prescriptions    Medication Sig Dispense Auth. Provider   benzonatate (TESSALON) 200 MG capsule Take 1 capsule (200 mg total) by mouth 3 (three) times daily as needed for up to 7 days for cough. 28 capsule Demonta Wombles C, PA-C   ibuprofen (ADVIL) 800 MG tablet Take 1 tablet (800 mg total) by mouth 3 (three) times daily. 21 tablet Jiyan Walkowski, Esko C, PA-C     PDMP not reviewed this encounter.   Lew Dawes, [place] 04/19/20 1312

## 2020-04-19 NOTE — Discharge Instructions (Signed)
Covid test pending, monitor my chart for results Tylenol and ibuprofen for any body aches, headaches, fevers Tessalon for cough as needed Continue Claritin-D, may continue other over-the-counter medicines for cough and congestion Rest and fluids Follow-up if not improving or worsening

## 2020-04-21 LAB — SARS-COV-2, NAA 2 DAY TAT

## 2020-04-21 LAB — NOVEL CORONAVIRUS, NAA: SARS-CoV-2, NAA: NOT DETECTED

## 2022-09-07 ENCOUNTER — Other Ambulatory Visit: Payer: Self-pay

## 2022-09-07 ENCOUNTER — Emergency Department (HOSPITAL_COMMUNITY): Payer: BLUE CROSS/BLUE SHIELD

## 2022-09-07 ENCOUNTER — Emergency Department (HOSPITAL_COMMUNITY)
Admission: EM | Admit: 2022-09-07 | Discharge: 2022-09-07 | Disposition: A | Discharge status: Home / Self Care | Payer: BLUE CROSS/BLUE SHIELD | Attending: Emergency Medicine | Admitting: Emergency Medicine

## 2022-09-07 DIAGNOSIS — S0990XA Unspecified injury of head, initial encounter: Secondary | ICD-10-CM

## 2022-09-07 DIAGNOSIS — M25562 Pain in left knee: Secondary | ICD-10-CM | POA: Insufficient documentation

## 2022-09-07 DIAGNOSIS — M25511 Pain in right shoulder: Secondary | ICD-10-CM | POA: Diagnosis not present

## 2022-09-07 DIAGNOSIS — W108XXA Fall (on) (from) other stairs and steps, initial encounter: Secondary | ICD-10-CM | POA: Insufficient documentation

## 2022-09-07 DIAGNOSIS — R102 Pelvic and perineal pain: Secondary | ICD-10-CM | POA: Insufficient documentation

## 2022-09-07 DIAGNOSIS — R519 Headache, unspecified: Secondary | ICD-10-CM | POA: Diagnosis present

## 2022-09-07 MED ORDER — ACETAMINOPHEN 500 MG PO TABS
1000.0000 mg | ORAL_TABLET | Freq: Once | ORAL | Status: AC
  Administered 2022-09-07: 1000 mg via ORAL
  Filled 2022-09-07: qty 2

## 2022-09-07 MED ORDER — OXYCODONE HCL 5 MG PO TABS
5.0000 mg | ORAL_TABLET | Freq: Once | ORAL | Status: AC
  Administered 2022-09-07: 5 mg via ORAL
  Filled 2022-09-07: qty 1

## 2022-09-07 NOTE — ED Provider Notes (Signed)
EMERGENCY DEPARTMENT AT [facility] Provider Note   CSN: [id number] Arrival date & time: 09/07/22  1542     History Chief Complaint  Patient presents with   Head Injury   Hand Pain    HPI Uwe Kois is a 37 y.o. male presenting for chief complaint of fall on stairs. He states that he lost balance going up the stairs last night he does not remember much of the event.  States he was knocked out unconscious for few moments after it happens.  He thought he was okay but today he comes in with headache, visual blurriness, fogginess, left knee pain, pelvic pain and right shoulder pain.  Patient's recorded medical, surgical, social, medication list and allergies were reviewed in the Snapshot window as part of the initial history.   Review of Systems   Review of Systems  Constitutional:  Negative for chills and fever.  HENT:  Negative for ear pain and sore throat.   Eyes:  Negative for pain and visual disturbance.  Respiratory:  Negative for cough and shortness of breath.   Cardiovascular:  Negative for chest pain and palpitations.  Gastrointestinal:  Negative for abdominal pain and vomiting.  Genitourinary:  Negative for dysuria and hematuria.  Musculoskeletal:  Negative for arthralgias and back pain.  Skin:  Negative for color change and rash.  Neurological:  Negative for seizures and syncope.  All other systems reviewed and are negative.   Physical Exam Updated Vital Signs BP (!) 133/94   Pulse 93   Temp 98.2 F (36.8 C) (Oral)   Resp 18   Ht 6\' 1"  (1.854 m)   Wt 111.1 kg   SpO2 99%   BMI 32.32 kg/m  Physical Exam Vitals and nursing note reviewed.  Constitutional:      General: He is not in acute distress.    Appearance: He is well-developed.  HENT:     Head: Normocephalic and atraumatic.  Eyes:     Conjunctiva/sclera: Conjunctivae normal.  Cardiovascular:     Rate and Rhythm: Normal rate and regular rhythm.     Heart sounds: No murmur  heard. Pulmonary:     Effort: Pulmonary effort is normal. No respiratory distress.     Breath sounds: Normal breath sounds.  Abdominal:     Palpations: Abdomen is soft.     Tenderness: There is no abdominal tenderness.  Musculoskeletal:        General: Tenderness and deformity present. No swelling.     Cervical back: Neck supple.  Skin:    General: Skin is warm and dry.     Capillary Refill: Capillary refill takes less than 2 seconds.  Neurological:     Mental Status: He is alert.  Psychiatric:        Mood and Affect: Mood normal.      ED Course/ Medical Decision Making/ A&P    Procedures Procedures   Medications Ordered in ED Medications  acetaminophen (TYLENOL) tablet 1,000 mg (1,000 mg Oral Given 09/07/22 1621)  oxyCODONE (Oxy IR/ROXICODONE) immediate release tablet 5 mg (5 mg Oral Given 09/07/22 1621)   Medical Decision Making:    Knut Zamani is a 38 y.o. male who presented to the ED today with a moderate mechanisma trauma, detailed above.    Additional history discussed with patient's family/caregivers.  Patient placed on continuous vitals and telemetry monitoring while in ED which was reviewed periodically.   Given this mechanism of trauma, a full physical exam was performed. Notably, patient  was HDS in NAD.   Reviewed and confirmed nursing documentation for past medical history, family history, social history.    Initial Assessment/Plan:   This is a patient presenting with a moderate mechanism trauma.  As such, I have considered intracranial injuries including intracranial hemorrhage, intrathoracic injuries including blunt myocardial or blunt lung injury, blunt abdominal injuries including aortic dissection, bladder injury, spleen injury, liver injury and I have considered orthopedic injuries including extremity or spinal injury.  With the patient's presentation of moderate mechanism trauma but an otherwise reassuring exam, patient warrants targeted evaluation for  potential traumatic injuries. Will proceed with targeted evaluation for potential injuries. Will proceed with CT head, CT C-spine due to syncope and severity of neck pain on arrival will additionally evaluate left knee, left wrist, chest and pelvis with x-rays given diffuse pain and mechanism of injury. Objective evaluation resulted with no acute pathology.   Final Reassessment and Plan:   Fortunately no acute pathology detected.  Patient suffering from closed head injury with likely concussive syndrome.  Recommend patient follow-up closely primary care provider in outpatient setting supportive care reinforced pain under control on reassessment.  Patient stable for outpatient care management with strict return precautions.   Disposition:  I have considered need for hospitalization, however, considering all of the above, I believe this patient is stable for discharge at this time.  Patient/family educated about specific return precautions for given chief complaint and symptoms.  Patient/family educated about follow-up with PCP.     Patient/family expressed understanding of return precautions and need for follow-up. Patient spoken to regarding all imaging and laboratory results and appropriate follow up for these results. All education provided in verbal form with additional information in written form. Time was allowed for answering of patient questions. Patient discharged.    Emergency Department Medication Summary:   Medications  acetaminophen (TYLENOL) tablet 1,000 mg (1,000 mg Oral Given 09/07/22 1621)  oxyCODONE (Oxy IR/ROXICODONE) immediate release tablet 5 mg (5 mg Oral Given 09/07/22 1621)           Clinical Impression:  1. Injury of head, initial encounter      Discharge   Final Clinical Impression(s) / ED Diagnoses Final diagnoses:  Injury of head, initial encounter    Rx / DC Orders ED Discharge Orders     None         Glyn Ade, MD 09/07/22 1758

## 2022-09-07 NOTE — ED Triage Notes (Addendum)
Pt arrived via POV. C/o head pain, and R hand pain after tripping on stairs and having their head go through the wood rail siding. Pt had LOC. Not on blood thinners  Does report some nausea  AOx4
